# Patient Record
Sex: Female | Born: 1970 | Race: Black or African American | Hispanic: No | Marital: Married | Smoking: Never smoker
Health system: Southern US, Community
[De-identification: ages and names within clinical notes are randomized; demographics above are authoritative.]

---

## 2014-04-29 ENCOUNTER — Ambulatory Visit (INDEPENDENT_AMBULATORY_CARE_PROVIDER_SITE_OTHER): Payer: BC Managed Care – PPO | Admitting: Family Medicine

## 2014-04-29 ENCOUNTER — Other Ambulatory Visit: Payer: Self-pay | Admitting: Family Medicine

## 2014-04-29 VITALS — BP 142/80 | HR 67 | Temp 97.9°F | Resp 16 | Ht 60.25 in | Wt 164.6 lb

## 2014-04-29 DIAGNOSIS — Z862 Personal history of diseases of the blood and blood-forming organs and certain disorders involving the immune mechanism: Secondary | ICD-10-CM

## 2014-04-29 DIAGNOSIS — Z8639 Personal history of other endocrine, nutritional and metabolic disease: Secondary | ICD-10-CM

## 2014-04-29 DIAGNOSIS — J029 Acute pharyngitis, unspecified: Secondary | ICD-10-CM

## 2014-04-29 DIAGNOSIS — E049 Nontoxic goiter, unspecified: Secondary | ICD-10-CM

## 2014-04-29 LAB — COMPREHENSIVE METABOLIC PANEL
ALK PHOS: 49 U/L (ref 39–117)
ALT: 15 U/L (ref 0–35)
AST: 19 U/L (ref 0–37)
Albumin: 4.5 g/dL (ref 3.5–5.2)
BILIRUBIN TOTAL: 0.4 mg/dL (ref 0.2–1.2)
BUN: 6 mg/dL (ref 6–23)
CO2: 26 mEq/L (ref 19–32)
CREATININE: 0.65 mg/dL (ref 0.50–1.10)
Calcium: 9.8 mg/dL (ref 8.4–10.5)
Chloride: 102 mEq/L (ref 96–112)
GLUCOSE: 90 mg/dL (ref 70–99)
Potassium: 4.4 mEq/L (ref 3.5–5.3)
SODIUM: 137 meq/L (ref 135–145)
TOTAL PROTEIN: 7.8 g/dL (ref 6.0–8.3)

## 2014-04-29 LAB — CBC
HCT: 38.5 % (ref 36.0–46.0)
HEMOGLOBIN: 12.7 g/dL (ref 12.0–15.0)
MCH: 23.2 pg — AB (ref 26.0–34.0)
MCHC: 33 g/dL (ref 30.0–36.0)
MCV: 70.4 fL — AB (ref 78.0–100.0)
Platelets: 381 10*3/uL (ref 150–400)
RBC: 5.47 MIL/uL — ABNORMAL HIGH (ref 3.87–5.11)
RDW: 14.5 % (ref 11.5–15.5)
WBC: 4.6 10*3/uL (ref 4.0–10.5)

## 2014-04-29 NOTE — Progress Notes (Signed)
[facility] [address], [place] [id number] [phone]  Date:  04/29/2014   Name:  Elaine Cortez   DOB:  01/24/1971   MRN:  [id number]  PCP:  No primary provider on file.    Chief Complaint: Sore Throat   History of Present Illness:  Elaine Cortez is a 43 y.o. very pleasant female patient who presents with the following:  Here today as a new patient with a ST  She first noted a dry feeling back in March- she was living in [place] at that time. She was seen and had a negative strep, tried abx.  Her throat never seemed to get better.  The ST will come and go. She has not noted a sneeze, runny nose, cough, fever.  She will have chills some of the time.  No GI symptoms.   She was treated with augmentin, prednisone and viscous lidocaine.    She is generally in good health [place].   LMP was last week.   There are no active problems to display for this patient.   No past medical history on file.  No past surgical history on file.  History  Substance Use Topics  . Smoking status: Never Smoker   . Smokeless tobacco: Not on file  . Alcohol Use: No    No family history on file.  Allergies not on file  Medication list has been reviewed and updated.  No current outpatient prescriptions on file prior to visit.   No current facility-administered medications on file prior to visit.    Review of Systems:  As per HPI- otherwise negative.   Physical Examination: Filed Vitals:   04/29/14 1057  BP: 142/80  Pulse: 67  Temp: 97.9 F (36.6 C)  Resp: 16   Filed Vitals:   04/29/14 1057  Height: 5' 0.25" (1.53 m)  Weight: 164 lb 9.6 oz (74.662 kg)   Body mass index is 31.89 kg/(m^2). Ideal Body Weight: Weight in (lb) to have BMI = 25: 128.8  GEN: WDWN, NAD, Non-toxic, A & O x 3, overweight, looks well HEENT: Atraumatic, Normocephalic. Neck supple. No LAD.  Bilateral TM wnl, oropharynx  normal.  PEERL,EOMI.   Her thyroid is enlarged with no palpable nodules Ears and Nose: No external deformity. CV: RRR, No M/G/R. No JVD. No thrill. No extra heart sounds. PULM: CTA B, no wheezes, crackles, rhonchi. No retractions. No resp. distress. No accessory muscle use. ABD: S, NT, ND. No rebound. No HSM. EXTR: No c/c/e NEURO Normal gait.  PSYCH: Normally interactive. Conversant. Not depressed or anxious appearing.  Calm demeanor.    Assessment and Plan: Acute pharyngitis - Plan: CBC, Comprehensive metabolic panel  Thyroid enlargement - Plan: TSH, T3, Free, T4, Free, US Soft Tissue Head/Neck  History of hypothyroidism - Plan: TSH   Your sore throat is likely either due to allergies or possibly to reflux.  Try taking some OTC claritin or zyrtec.  If this is not helpful after a week or so, try taking an OTC zantac or pepcid.  I will be in touch with your labs, and we will set up a thyroid ultrasound for you.  Let me know if you are getting worse!  Nice to see you today Signed Abbe AmsterdamJessica Galena Logie, MD

## 2014-04-29 NOTE — Patient Instructions (Signed)
Your sore throat is likely either due to allergies or possibly to reflux.  Try taking some OTC claritin or zyrtec.  If this is not helpful after a week or so, try taking an OTC zantac or pepcid.  I will be in touch with your labs, and we will set up a thyroid ultrasound for you.  Let me know if you are getting worse!  Nice to see you today

## 2014-04-30 LAB — T4, FREE: Free T4: 1.29 ng/dL (ref 0.80–1.80)

## 2014-04-30 LAB — T3, FREE: T3 FREE: 3.6 pg/mL (ref 2.3–4.2)

## 2014-04-30 LAB — TSH: TSH: 0.478 u[IU]/mL (ref 0.350–4.500)

## 2014-05-02 ENCOUNTER — Other Ambulatory Visit: Payer: Self-pay

## 2014-05-02 ENCOUNTER — Ambulatory Visit
Admission: RE | Admit: 2014-05-02 | Discharge: 2014-05-02 | Disposition: A | Discharge status: Home / Self Care | Payer: BC Managed Care – PPO | Source: Ambulatory Visit | Attending: Family Medicine | Admitting: Family Medicine

## 2014-05-02 DIAGNOSIS — E049 Nontoxic goiter, unspecified: Secondary | ICD-10-CM

## 2014-05-03 ENCOUNTER — Encounter: Payer: Self-pay | Admitting: Family Medicine

## 2014-05-04 LAB — FERRITIN: FERRITIN: 59 ng/mL (ref 10–291)

## 2015-05-14 IMAGING — US US SOFT TISSUE HEAD/NECK
1 series · 13 of 25 positions shown · non-contrast
Comparison: None.

CLINICAL DATA: Thyroid enlargement

EXAM:
THYROID ULTRASOUND
TECHNIQUE: Ultrasound examination of the thyroid gland and adjacent soft
tissues was performed.

[Series 1: us soft tissue head/neck · 0.08mm/px · 13 of 39 slices shown]
[im 1/39]
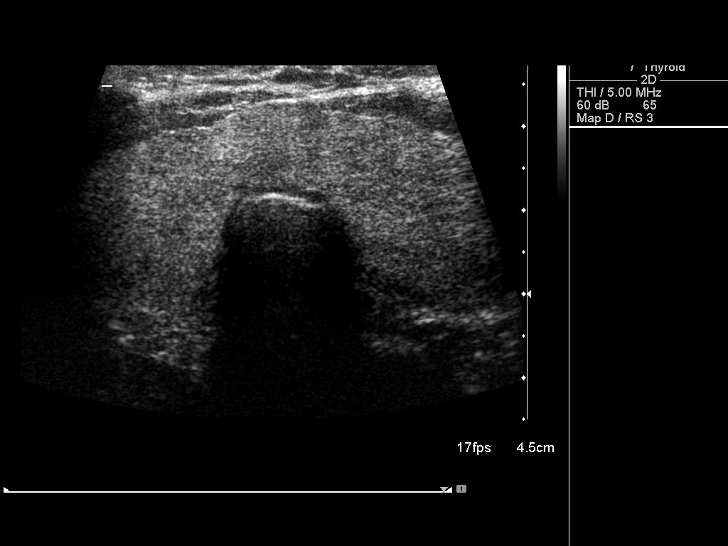
[im 4/39]
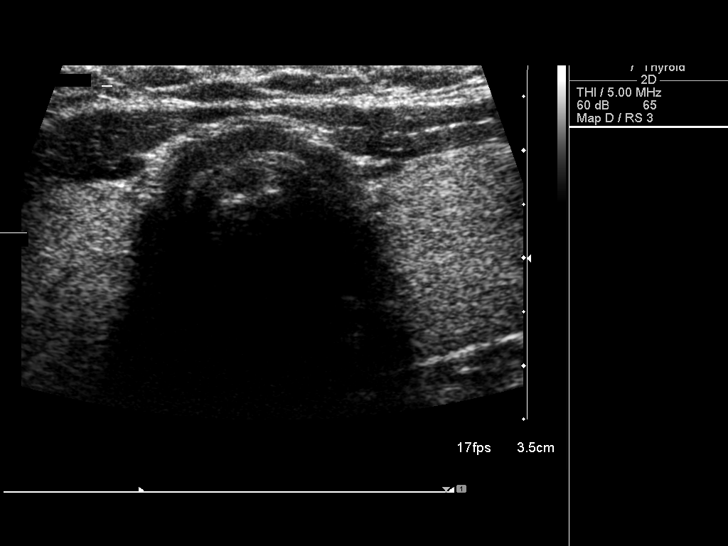
[im 7/39]
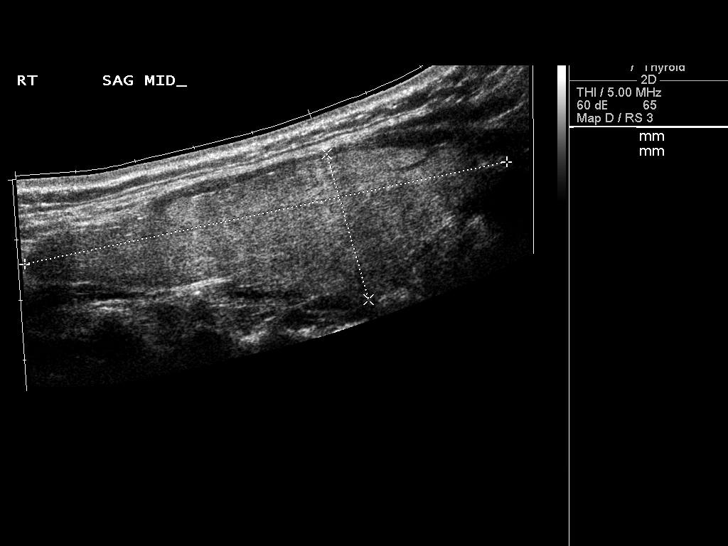
[im 10/39]
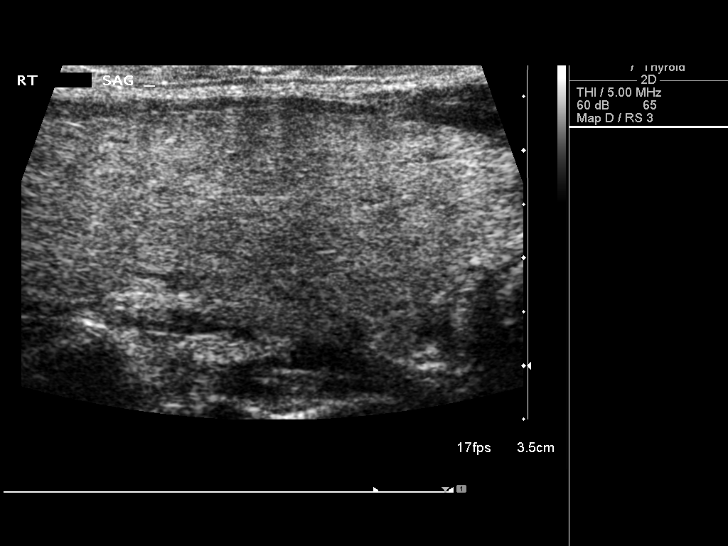
[im 13/39]
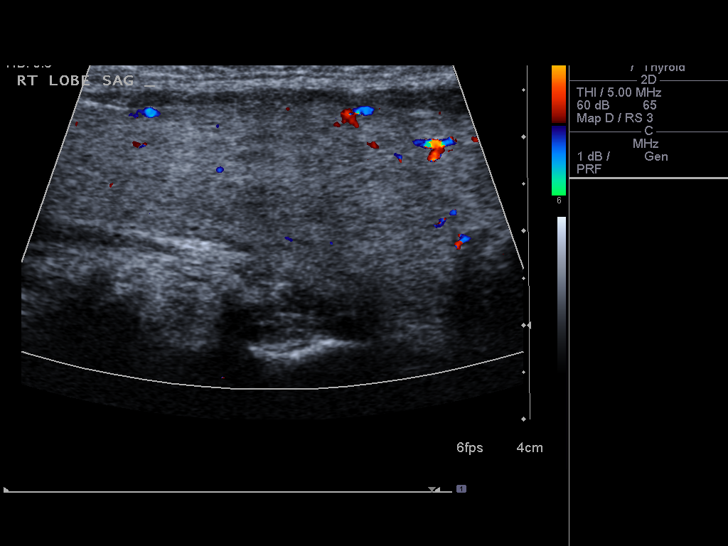
[im 16/39]
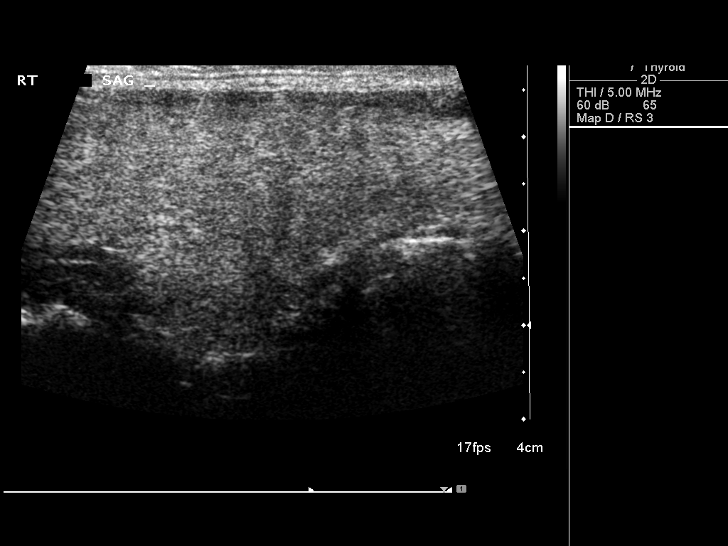
[im 20/39]
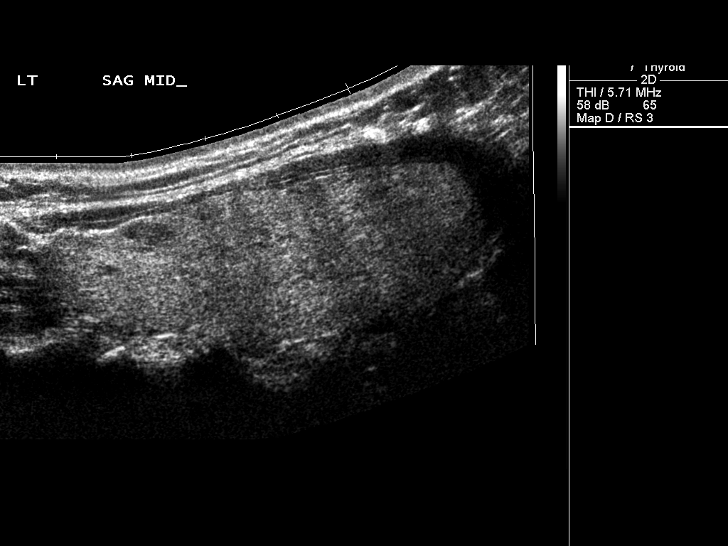
[im 23/39]
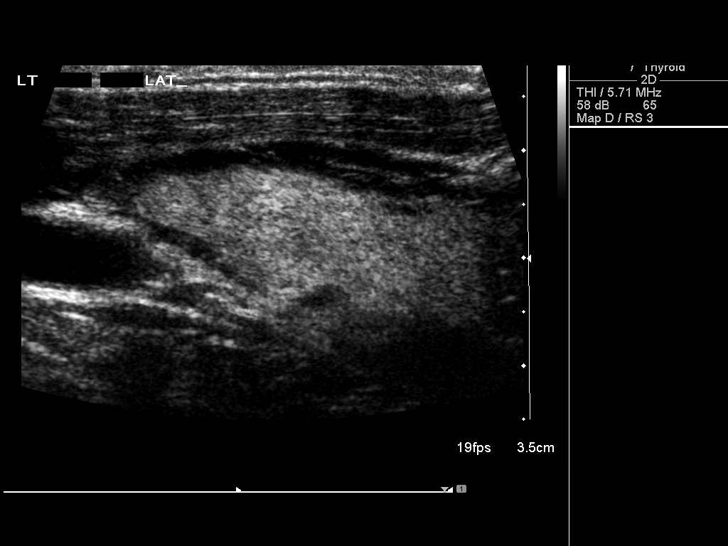
[im 26/39]
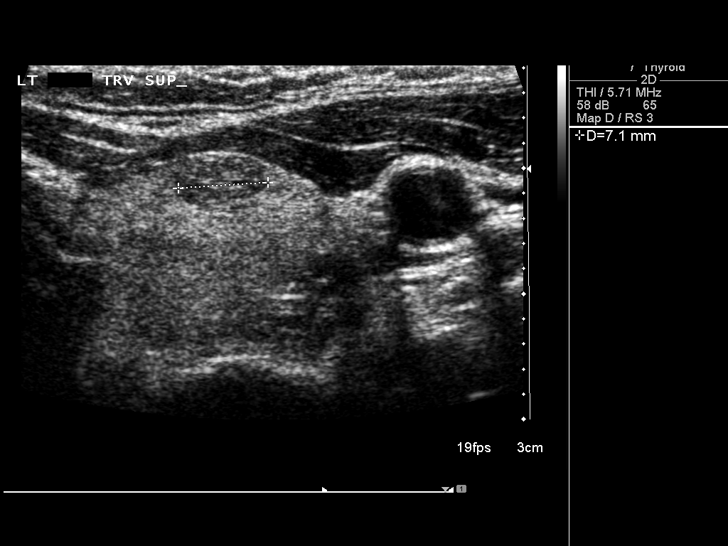
[im 29/39]
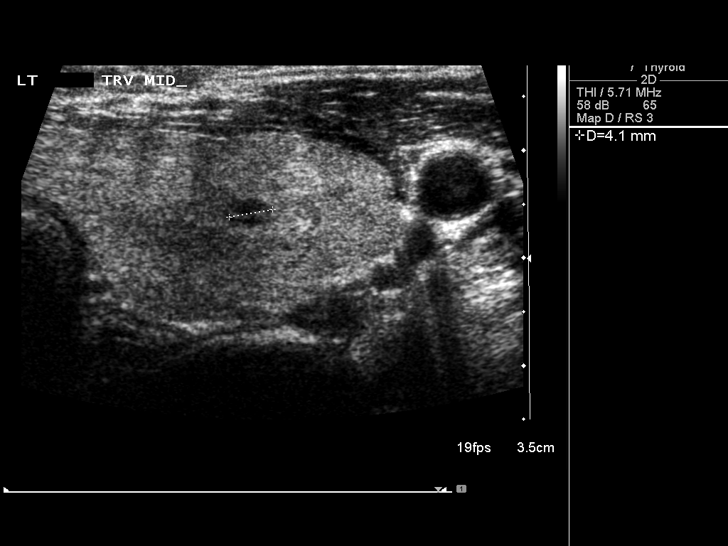
[im 32/39]
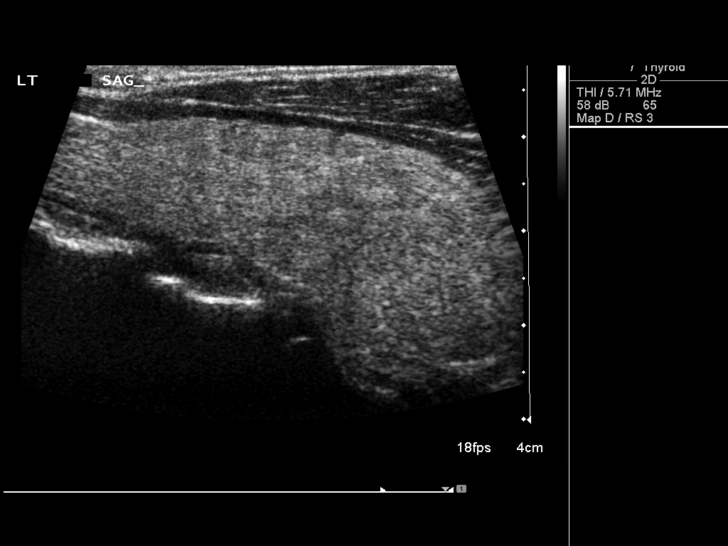
[im 35/39]
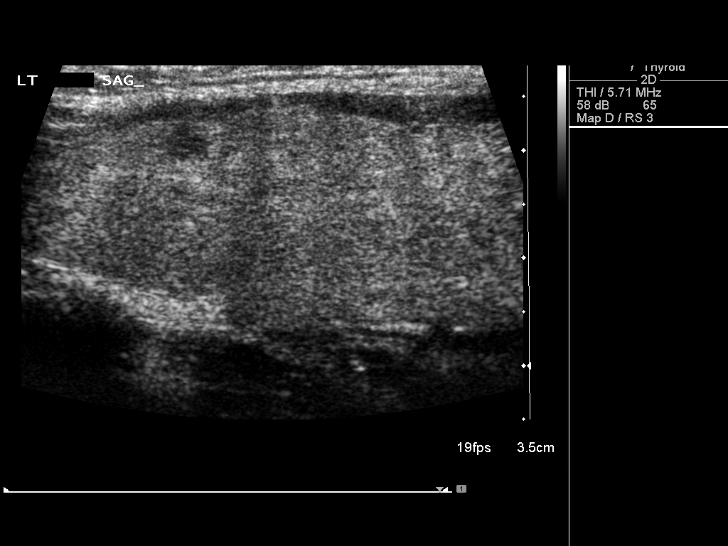
[im 39/39]
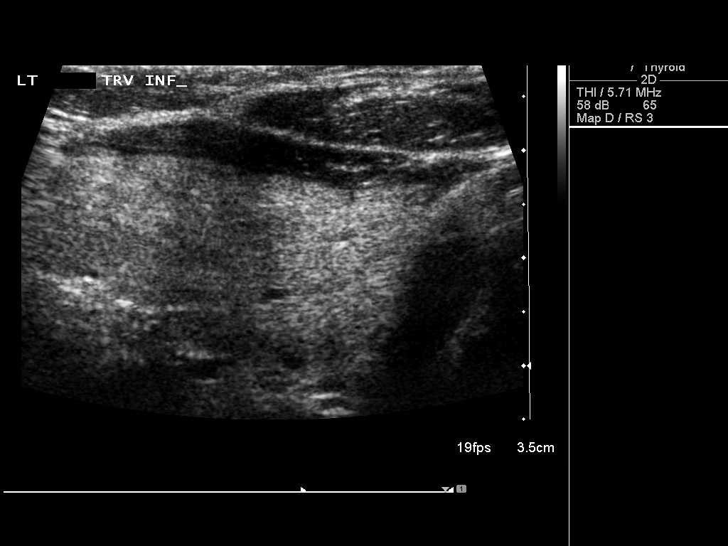

[13 of 25 positions shown; findings below may reference images not displayed]

FINDINGS: There is relative homogeneity of the thyroid parenchymal
echotexture.

Right thyroid lobe

Measurements: Enlarged measuring 8.2 x 2.5 x 3.0 cm.

No discrete nodules are identified within the right lobe of the
thyroid.

Left thyroid lobe

Measurements: Enlarged measuring 6.1 x 2.2 x 2.9 cm.

Left, superior - 0.6 x 0.5 x 0.7 cm - mixed echogenic, solid.

Left, mid - 0.4 x 0.3 x 0.4 cm - hypoechoic with internal echogenic
nodules suggestive of colloid.

Isthmus

Thickness: Enlarged measuring 0.8 cm in diameter.

No discrete nodules are identified within the thyroid isthmus.

Lymphadenopathy

None visualized.
IMPRESSION: 1. Nonspecific diffuse enlargement of the thyroid gland.
2. The 2 subcentimeter nodules within the left lobe of the thyroid
do not meet imaging criteria to recommend percutaneous sampling.
This recommendation follows the consensus statement: Management of
Thyroid Nodules Detected at US: Society of Radiologists in
Ultrasound Consensus Conference Statement. Radiology 5995;

## 2016-01-12 ENCOUNTER — Ambulatory Visit (INDEPENDENT_AMBULATORY_CARE_PROVIDER_SITE_OTHER): Payer: Self-pay | Admitting: Emergency Medicine

## 2016-01-12 VITALS — BP 130/82 | HR 61 | Temp 97.6°F | Resp 16 | Ht 62.5 in | Wt 155.0 lb

## 2016-01-12 DIAGNOSIS — Z23 Encounter for immunization: Secondary | ICD-10-CM

## 2016-01-12 DIAGNOSIS — Z111 Encounter for screening for respiratory tuberculosis: Secondary | ICD-10-CM

## 2016-01-12 NOTE — Progress Notes (Signed)
   Subjective:    Patient ID: Elaine Cortez, female    DOB: Apr 12, 1971, 45 y.o.   MRN: [id number]  HPI    Review of Systems     Objective:   Physical Exam    . Tuberculosis Risk Questionnaire  1. Yes  Were you born outside the [place] in one of the following parts of the world: [place], [place], [place], [place] or [place]?    2. Yes  Have you traveled outside the [place] and lived for more than one month in one of the following parts of the world: [place], [place], [place], [place] or [place]?    3. No Do you have a compromised immune system such as from any of the following conditions:HIV/AIDS, organ or bone marrow transplantation, diabetes, immunosuppressive medicines (e.g. Prednisone, Remicaide), leukemia, lymphoma, cancer of the head or neck, gastrectomy or jejunal bypass, end-stage renal disease (on dialysis), or silicosis?     4. Yes  Have you ever or do you plan on working in: a residential care center, a health care facility, a jail or prison or homeless shelter?    5. No Have you ever: injected illegal drugs, used crack cocaine, lived in a homeless shelter  or been in jail or prison?     6. No Have you ever been exposed to anyone with infectious tuberculosis?    Tuberculosis Symptom Questionnaire  Do you currently have any of the following symptoms?  1. No Unexplained cough lasting more than 3 weeks?   2. No Unexplained fever lasting more than 3 weeks.   3. No Night Sweats (sweating that leaves the bedclothes and sheets wet)     4. No Shortness of Breath   5. No Chest Pain   6. No Unintentional weight loss    7. No Unexplained fatigue (very tired for no reason)      Assessment & Plan:

## 2016-01-12 NOTE — Progress Notes (Signed)
    By signing my name below, I, Raven Small, attest that this documentation has been prepared under the direction and in the presence of Lesle Chris, MD.  Electronically Signed: Andrew Au, ED Scribe. 01/12/2016. 1:04 PM.  Chief Complaint:  Chief Complaint  Patient presents with  . Flu Vaccine  . PPD    HPI: Elaine Cortez is a 45 y.o. female who reports to [facility] today for TB test and flu shot. Pt is studying to become a CMA at [employer] she denies negative TB screen in the past. Pt is from [place]. She reports having BCG done.     History reviewed. No pertinent past medical history. History reviewed. No pertinent past surgical history. Social History   Social History  . Marital Status: Married    Spouse Name: N/A  . Number of Children: N/A  . Years of Education: N/A   Social History Main Topics  . Smoking status: Never Smoker   . Smokeless tobacco: None  . Alcohol Use: No  . Drug Use: No  . Sexual Activity: Not Asked   Other Topics Concern  . None   Social History Narrative   Family History  Problem Relation Age of Onset  . Diabetes Mother   . Hypertension Father    No Known Allergies Prior to Admission medications   Not on File     ROS: The patient denies fevers, chills, night sweats, unintentional weight loss, chest pain, palpitations, wheezing, dyspnea on exertion, nausea, vomiting, abdominal pain, dysuria, hematuria, melena, numbness, weakness, or tingling.   All other systems have been reviewed and were otherwise negative with the exception of those mentioned in the HPI and as above.    PHYSICAL EXAM: Filed Vitals:   01/12/16 1236  BP: 130/82  Pulse: 61  Temp: 97.6 F (36.4 C)  Resp: 16   Body mass index is 27.88 kg/(m^2).   ASSESSMENT/PLAN: Patient was not examined today. She was given a flu shot and a PPD was applied. Of note she has had BCG in the past. I personally performed the services described in this documentation, which was scribed in  my presence. The recorded information has been reviewed and is accurate.  Gross sideeffects, risk and benefits, and alternatives of medications d/w patient. Patient is aware that all medications have potential sideeffects and we are unable to predict every sideeffect or drug-drug interaction that may occur.  Lesle Chris MD 01/12/2016 1:03 PM

## 2016-01-14 ENCOUNTER — Encounter: Payer: Self-pay | Admitting: *Deleted

## 2016-01-14 DIAGNOSIS — Z111 Encounter for screening for respiratory tuberculosis: Secondary | ICD-10-CM

## 2016-01-14 LAB — TB SKIN TEST
INDURATION: 0 mm
TB SKIN TEST: NEGATIVE
